# Patient Record
Sex: Male | Born: 1965 | Race: White | Hispanic: No | Marital: Single | Smoking: Current every day smoker
Health system: Southern US, Community
[De-identification: ages and names within clinical notes are randomized; demographics above are authoritative.]

## PROBLEM LIST (undated history)

## (undated) DIAGNOSIS — C9590 Leukemia, unspecified not having achieved remission: Secondary | ICD-10-CM

## (undated) DIAGNOSIS — Z720 Tobacco use: Secondary | ICD-10-CM

## (undated) HISTORY — PX: CHOLECYSTECTOMY: SHX55

---

## 2020-09-26 ENCOUNTER — Emergency Department (HOSPITAL_COMMUNITY): Payer: Medicare Other

## 2020-09-26 ENCOUNTER — Observation Stay (HOSPITAL_COMMUNITY)
Admission: EM | Admit: 2020-09-26 | Discharge: 2020-09-27 | Disposition: A | Discharge status: Home / Self Care | Payer: Medicare Other | Attending: Internal Medicine | Admitting: Internal Medicine

## 2020-09-26 DIAGNOSIS — Z7901 Long term (current) use of anticoagulants: Secondary | ICD-10-CM | POA: Insufficient documentation

## 2020-09-26 DIAGNOSIS — R7401 Elevation of levels of liver transaminase levels: Secondary | ICD-10-CM | POA: Insufficient documentation

## 2020-09-26 DIAGNOSIS — R531 Weakness: Secondary | ICD-10-CM | POA: Diagnosis not present

## 2020-09-26 DIAGNOSIS — S42354A Nondisplaced comminuted fracture of shaft of humerus, right arm, initial encounter for closed fracture: Secondary | ICD-10-CM

## 2020-09-26 DIAGNOSIS — I313 Pericardial effusion (noninflammatory): Secondary | ICD-10-CM | POA: Diagnosis not present

## 2020-09-26 DIAGNOSIS — N179 Acute kidney failure, unspecified: Secondary | ICD-10-CM

## 2020-09-26 DIAGNOSIS — S42351A Displaced comminuted fracture of shaft of humerus, right arm, initial encounter for closed fracture: Secondary | ICD-10-CM | POA: Diagnosis not present

## 2020-09-26 DIAGNOSIS — S4991XA Unspecified injury of right shoulder and upper arm, initial encounter: Secondary | ICD-10-CM | POA: Diagnosis present

## 2020-09-26 DIAGNOSIS — R2681 Unsteadiness on feet: Secondary | ICD-10-CM | POA: Diagnosis not present

## 2020-09-26 DIAGNOSIS — N200 Calculus of kidney: Secondary | ICD-10-CM | POA: Insufficient documentation

## 2020-09-26 DIAGNOSIS — Z7902 Long term (current) use of antithrombotics/antiplatelets: Secondary | ICD-10-CM | POA: Diagnosis not present

## 2020-09-26 DIAGNOSIS — Z23 Encounter for immunization: Secondary | ICD-10-CM | POA: Diagnosis not present

## 2020-09-26 DIAGNOSIS — D6851 Activated protein C resistance: Secondary | ICD-10-CM | POA: Diagnosis present

## 2020-09-26 DIAGNOSIS — J189 Pneumonia, unspecified organism: Secondary | ICD-10-CM | POA: Insufficient documentation

## 2020-09-26 DIAGNOSIS — T1490XA Injury, unspecified, initial encounter: Secondary | ICD-10-CM

## 2020-09-26 DIAGNOSIS — C9590 Leukemia, unspecified not having achieved remission: Secondary | ICD-10-CM

## 2020-09-26 DIAGNOSIS — E871 Hypo-osmolality and hyponatremia: Secondary | ICD-10-CM | POA: Diagnosis not present

## 2020-09-26 DIAGNOSIS — Z79899 Other long term (current) drug therapy: Secondary | ICD-10-CM | POA: Insufficient documentation

## 2020-09-26 DIAGNOSIS — Y900 Blood alcohol level of less than 20 mg/100 ml: Secondary | ICD-10-CM | POA: Insufficient documentation

## 2020-09-26 DIAGNOSIS — R55 Syncope and collapse: Secondary | ICD-10-CM

## 2020-09-26 DIAGNOSIS — R748 Abnormal levels of other serum enzymes: Secondary | ICD-10-CM | POA: Diagnosis not present

## 2020-09-26 DIAGNOSIS — S42291A Other displaced fracture of upper end of right humerus, initial encounter for closed fracture: Secondary | ICD-10-CM

## 2020-09-26 DIAGNOSIS — D72829 Elevated white blood cell count, unspecified: Secondary | ICD-10-CM | POA: Diagnosis not present

## 2020-09-26 DIAGNOSIS — D684 Acquired coagulation factor deficiency: Secondary | ICD-10-CM | POA: Insufficient documentation

## 2020-09-26 DIAGNOSIS — Z20822 Contact with and (suspected) exposure to covid-19: Secondary | ICD-10-CM | POA: Insufficient documentation

## 2020-09-26 DIAGNOSIS — F17210 Nicotine dependence, cigarettes, uncomplicated: Secondary | ICD-10-CM | POA: Diagnosis not present

## 2020-09-26 HISTORY — DX: Leukemia, unspecified not having achieved remission: C95.90

## 2020-09-26 HISTORY — DX: Tobacco use: Z72.0

## 2020-09-26 LAB — CK: Total CK: 715 U/L — ABNORMAL HIGH (ref 49–397)

## 2020-09-26 LAB — COMPREHENSIVE METABOLIC PANEL
ALT: 29 U/L (ref 0–44)
AST: 88 U/L — ABNORMAL HIGH (ref 15–41)
Albumin: 2.8 g/dL — ABNORMAL LOW (ref 3.5–5.0)
Alkaline Phosphatase: 47 U/L (ref 38–126)
Anion gap: 10 (ref 5–15)
BUN: 21 mg/dL — ABNORMAL HIGH (ref 6–20)
CO2: 21 mmol/L — ABNORMAL LOW (ref 22–32)
Calcium: 8.5 mg/dL — ABNORMAL LOW (ref 8.9–10.3)
Chloride: 96 mmol/L — ABNORMAL LOW (ref 98–111)
Creatinine, Ser: 1.29 mg/dL — ABNORMAL HIGH (ref 0.61–1.24)
GFR, Estimated: >60 mL/min (ref >60)
Glucose, Bld: 116 mg/dL — ABNORMAL HIGH (ref 70–99)
Potassium: 4.8 mmol/L (ref 3.5–5.1)
Sodium: 127 mmol/L — ABNORMAL LOW (ref 135–145)
Total Bilirubin: 1.9 mg/dL — ABNORMAL HIGH (ref 0.3–1.2)
Total Protein: 5.9 g/dL — ABNORMAL LOW (ref 6.5–8.1)

## 2020-09-26 LAB — CBC
HCT: 42 % (ref 39.0–52.0)
Hemoglobin: 14.7 g/dL (ref 13.0–17.0)
MCH: 31.6 pg (ref 26.0–34.0)
MCHC: 35 g/dL (ref 30.0–36.0)
MCV: 90.3 fL (ref 80.0–100.0)
Platelets: 172 10*3/uL (ref 150–400)
RBC: 4.65 MIL/uL (ref 4.22–5.81)
RDW: 12.4 % (ref 11.5–15.5)
WBC: 11 10*3/uL — ABNORMAL HIGH (ref 4.0–10.5)
nRBC: 0 % (ref 0.0–0.2)

## 2020-09-26 LAB — RAPID URINE DRUG SCREEN, HOSP PERFORMED
Amphetamines: NOT DETECTED
Barbiturates: NOT DETECTED
Benzodiazepines: NOT DETECTED
Cocaine: NOT DETECTED
Opiates: NOT DETECTED
Tetrahydrocannabinol: POSITIVE — AB

## 2020-09-26 LAB — URINALYSIS, ROUTINE W REFLEX MICROSCOPIC
Bilirubin Urine: NEGATIVE
Glucose, UA: NEGATIVE mg/dL
Ketones, ur: NEGATIVE mg/dL
Leukocytes,Ua: NEGATIVE
Nitrite: NEGATIVE
Protein, ur: 30 mg/dL — AB
RBC / HPF: >50 RBC/hpf — ABNORMAL HIGH (ref 0–5)
Specific Gravity, Urine: 1.024 (ref 1.005–1.030)
pH: 5 (ref 5.0–8.0)

## 2020-09-26 LAB — ETHANOL: Alcohol, Ethyl (B): <10 mg/dL (ref <10)

## 2020-09-26 LAB — HEPATITIS PANEL, ACUTE
HCV Ab: NONREACTIVE
Hep A IgM: NONREACTIVE
Hep B C IgM: NONREACTIVE
Hepatitis B Surface Ag: NONREACTIVE

## 2020-09-26 LAB — SAMPLE TO BLOOD BANK

## 2020-09-26 LAB — LACTIC ACID, PLASMA: Lactic Acid, Venous: 1.9 mmol/L (ref 0.5–1.9)

## 2020-09-26 LAB — PROTIME-INR
INR: 1.3 — ABNORMAL HIGH (ref 0.8–1.2)
Prothrombin Time: 16.2 seconds — ABNORMAL HIGH (ref 11.4–15.2)

## 2020-09-26 LAB — RESP PANEL BY RT-PCR (FLU A&B, COVID) ARPGX2
Influenza A by PCR: NEGATIVE
Influenza B by PCR: NEGATIVE
SARS Coronavirus 2 by RT PCR: NEGATIVE

## 2020-09-26 LAB — PROCALCITONIN: Procalcitonin: 0.44 ng/mL

## 2020-09-26 LAB — HIV ANTIBODY (ROUTINE TESTING W REFLEX): HIV Screen 4th Generation wRfx: NONREACTIVE

## 2020-09-26 MED ORDER — ACETAMINOPHEN 325 MG PO TABS
650.0000 mg | ORAL_TABLET | Freq: Four times a day (QID) | ORAL | Status: DC | PRN
  Administered 2020-09-26: 650 mg via ORAL
  Filled 2020-09-26: qty 2

## 2020-09-26 MED ORDER — ENOXAPARIN SODIUM 40 MG/0.4ML IJ SOSY
40.0000 mg | PREFILLED_SYRINGE | INTRAMUSCULAR | Status: DC
  Administered 2020-09-27: 40 mg via SUBCUTANEOUS
  Filled 2020-09-26: qty 0.4

## 2020-09-26 MED ORDER — SODIUM CHLORIDE 0.9% FLUSH
3.0000 mL | Freq: Two times a day (BID) | INTRAVENOUS | Status: DC
  Administered 2020-09-26 – 2020-09-27 (×2): 3 mL via INTRAVENOUS

## 2020-09-26 MED ORDER — SODIUM CHLORIDE 0.9 % IV SOLN: INTRAVENOUS | Status: DC

## 2020-09-26 MED ORDER — ACETAMINOPHEN 650 MG RE SUPP: 650.0000 mg | Freq: Four times a day (QID) | RECTAL | Status: DC | PRN

## 2020-09-26 MED ORDER — OXYCODONE HCL 5 MG PO TABS
5.0000 mg | ORAL_TABLET | ORAL | Status: DC | PRN
  Administered 2020-09-26 – 2020-09-27 (×5): 5 mg via ORAL
  Filled 2020-09-26 (×5): qty 1

## 2020-09-26 MED ORDER — ALBUTEROL SULFATE (2.5 MG/3ML) 0.083% IN NEBU: 2.5000 mg | INHALATION_SOLUTION | Freq: Four times a day (QID) | RESPIRATORY_TRACT | Status: DC | PRN

## 2020-09-26 MED ORDER — FENTANYL CITRATE (PF) 100 MCG/2ML IJ SOLN
50.0000 ug | Freq: Once | INTRAMUSCULAR | Status: AC
  Administered 2020-09-26: 50 ug via INTRAVENOUS

## 2020-09-26 MED ORDER — OXYCODONE HCL 5 MG PO TABS
5.0000 mg | ORAL_TABLET | Freq: Once | ORAL | Status: AC
  Administered 2020-09-26: 5 mg via ORAL
  Filled 2020-09-26: qty 1

## 2020-09-26 MED ORDER — MORPHINE SULFATE (PF) 2 MG/ML IV SOLN
2.0000 mg | INTRAVENOUS | Status: DC | PRN
  Administered 2020-09-26: 2 mg via INTRAVENOUS
  Filled 2020-09-26: qty 1

## 2020-09-26 MED ORDER — SODIUM CHLORIDE 0.9 % IV SOLN
500.0000 mg | Freq: Once | INTRAVENOUS | Status: AC
  Administered 2020-09-26: 500 mg via INTRAVENOUS
  Filled 2020-09-26: qty 500

## 2020-09-26 MED ORDER — TETANUS-DIPHTH-ACELL PERTUSSIS 5-2.5-18.5 LF-MCG/0.5 IM SUSY
0.5000 mL | PREFILLED_SYRINGE | Freq: Once | INTRAMUSCULAR | Status: AC
  Administered 2020-09-26: 0.5 mL via INTRAMUSCULAR
  Filled 2020-09-26: qty 0.5

## 2020-09-26 MED ORDER — SODIUM CHLORIDE 0.9 % IV SOLN
1.0000 g | Freq: Once | INTRAVENOUS | Status: AC
  Administered 2020-09-26: 1 g via INTRAVENOUS
  Filled 2020-09-26: qty 10

## 2020-09-26 MED ORDER — FENTANYL CITRATE (PF) 100 MCG/2ML IJ SOLN
50.0000 ug | Freq: Once | INTRAMUSCULAR | Status: AC
  Administered 2020-09-26: 50 ug via INTRAVENOUS
  Filled 2020-09-26: qty 2
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
## 2020-09-26 NOTE — ED Notes (Signed)
Pt requesting pain medication; MD advised, requesting UDS. Will collect urine & send down

## 2020-09-26 NOTE — ED Notes (Signed)
Called for report, RN line busy, will call back with direct ext [phone].

## 2020-09-26 NOTE — Progress Notes (Signed)
Samuel Gill is a 55 y.o. male patient admitted. Awake, alert - oriented  X 4 - no acute distress noted.  VSS - Blood pressure 119/69, pulse 64, temperature 98.9 F (37.2 C), temperature source Oral, resp. rate 19, height '6\' 2"'$  (1.88 m), weight 104.3 kg, SpO2 97 %.    IV in place, occlusive dsg intact without redness.     Will cont to eval and treat per MD orders.  Vidal Schwalbe, RN 09/26/2020 2030
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
## 2020-09-26 NOTE — ED Notes (Signed)
Patient transported to X-ray 

## 2020-09-26 NOTE — ED Notes (Signed)
Called for report, floor is moving pt to different room and requests more time for the receiving RN.
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
## 2020-09-26 NOTE — ED Notes (Signed)
Patient transported to CT with TRN.  
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
## 2020-09-26 NOTE — Progress Notes (Signed)
Orthopedic Tech Progress Note Patient Details:  Samuel Gill [birth year] [id number]  Ortho Devices Type of Ortho Device: Sling immobilizer Ortho Device/Splint Location: RUE Ortho Device/Splint Interventions: Ordered, Application, Adjustment   Post Interventions Patient Tolerated: Well, Fair Instructions Provided: Care of device  Janit Pagan 09/26/2020, 2:47 PM
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
## 2020-09-27 ENCOUNTER — Observation Stay (HOSPITAL_BASED_OUTPATIENT_CLINIC_OR_DEPARTMENT_OTHER): Payer: Medicare Other

## 2020-09-27 ENCOUNTER — Encounter (HOSPITAL_COMMUNITY): Payer: Self-pay | Admitting: Internal Medicine

## 2020-09-27 DIAGNOSIS — S42351A Displaced comminuted fracture of shaft of humerus, right arm, initial encounter for closed fracture: Secondary | ICD-10-CM | POA: Diagnosis present

## 2020-09-27 DIAGNOSIS — D6851 Activated protein C resistance: Secondary | ICD-10-CM | POA: Diagnosis present

## 2020-09-27 DIAGNOSIS — E871 Hypo-osmolality and hyponatremia: Secondary | ICD-10-CM | POA: Diagnosis present

## 2020-09-27 DIAGNOSIS — R748 Abnormal levels of other serum enzymes: Secondary | ICD-10-CM | POA: Diagnosis present

## 2020-09-27 DIAGNOSIS — C9590 Leukemia, unspecified not having achieved remission: Secondary | ICD-10-CM | POA: Diagnosis present

## 2020-09-27 DIAGNOSIS — S42291A Other displaced fracture of upper end of right humerus, initial encounter for closed fracture: Secondary | ICD-10-CM | POA: Diagnosis not present

## 2020-09-27 DIAGNOSIS — I313 Pericardial effusion (noninflammatory): Secondary | ICD-10-CM | POA: Diagnosis not present

## 2020-09-27 DIAGNOSIS — N179 Acute kidney failure, unspecified: Secondary | ICD-10-CM | POA: Diagnosis present

## 2020-09-27 DIAGNOSIS — N200 Calculus of kidney: Secondary | ICD-10-CM

## 2020-09-27 LAB — CBC
HCT: 37.7 % — ABNORMAL LOW (ref 39.0–52.0)
Hemoglobin: 13.2 g/dL (ref 13.0–17.0)
MCH: 30.9 pg (ref 26.0–34.0)
MCHC: 35 g/dL (ref 30.0–36.0)
MCV: 88.3 fL (ref 80.0–100.0)
Platelets: 171 10*3/uL (ref 150–400)
RBC: 4.27 MIL/uL (ref 4.22–5.81)
RDW: 12.3 % (ref 11.5–15.5)
WBC: 11.1 10*3/uL — ABNORMAL HIGH (ref 4.0–10.5)
nRBC: 0 % (ref 0.0–0.2)

## 2020-09-27 LAB — ECHOCARDIOGRAM COMPLETE
Area-P 1/2: 2.9 cm2
Calc EF: 62.2 %
Height: 74 in
S' Lateral: 4.8 cm
Single Plane A2C EF: 69.3 %
Single Plane A4C EF: 48.3 %
Weight: 3680 oz

## 2020-09-27 LAB — POCT I-STAT, CHEM 8
BUN: 31 mg/dL — ABNORMAL HIGH (ref 6–20)
Calcium, Ion: 0.96 mmol/L — ABNORMAL LOW (ref 1.15–1.40)
Chloride: 96 mmol/L — ABNORMAL LOW (ref 98–111)
Creatinine, Ser: 1.2 mg/dL (ref 0.61–1.24)
Glucose, Bld: 114 mg/dL — ABNORMAL HIGH (ref 70–99)
HCT: 42 % (ref 39.0–52.0)
Hemoglobin: 14.3 g/dL (ref 13.0–17.0)
Potassium: 5.3 mmol/L — ABNORMAL HIGH (ref 3.5–5.1)
Sodium: 127 mmol/L — ABNORMAL LOW (ref 135–145)
TCO2: 25 mmol/L (ref 22–32)

## 2020-09-27 LAB — BASIC METABOLIC PANEL
Anion gap: 10 (ref 5–15)
BUN: 19 mg/dL (ref 6–20)
CO2: 23 mmol/L (ref 22–32)
Calcium: 8.5 mg/dL — ABNORMAL LOW (ref 8.9–10.3)
Chloride: 97 mmol/L — ABNORMAL LOW (ref 98–111)
Creatinine, Ser: 0.96 mg/dL (ref 0.61–1.24)
GFR, Estimated: >60 mL/min (ref >60)
Glucose, Bld: 113 mg/dL — ABNORMAL HIGH (ref 70–99)
Potassium: 3.7 mmol/L (ref 3.5–5.1)
Sodium: 130 mmol/L — ABNORMAL LOW (ref 135–145)

## 2020-09-27 LAB — PROCALCITONIN: Procalcitonin: 0.39 ng/mL

## 2020-09-27 MED ORDER — CLOPIDOGREL BISULFATE 75 MG PO TABS: 75.0000 mg | ORAL_TABLET | Freq: Every day | ORAL | Status: DC

## 2020-09-27 MED ORDER — AZITHROMYCIN 500 MG PO TABS: 500.0000 mg | ORAL_TABLET | Freq: Every day | ORAL | 0 refills | Status: AC

## 2020-09-27 MED ORDER — SODIUM CHLORIDE 0.9 % IV SOLN
1.0000 g | INTRAVENOUS | Status: DC
Start: 2020-09-27 — End: 2020-09-28
  Administered 2020-09-27: 1 g via INTRAVENOUS
  Filled 2020-09-27: qty 10

## 2020-09-27 MED ORDER — SODIUM CHLORIDE 0.9 % IV SOLN
500.0000 mg | INTRAVENOUS | Status: DC
Start: 2020-09-27 — End: 2020-09-28
  Administered 2020-09-27: 500 mg via INTRAVENOUS
  Filled 2020-09-27 (×2): qty 500

## 2020-09-27 MED ORDER — PERFLUTREN LIPID MICROSPHERE
1.0000 mL | INTRAVENOUS | Status: AC | PRN
  Administered 2020-09-27: 2 mL via INTRAVENOUS
  Filled 2020-09-27: qty 10

## 2020-09-27 MED ORDER — GUAIFENESIN ER 600 MG PO TB12
600.0000 mg | ORAL_TABLET | Freq: Two times a day (BID) | ORAL | Status: DC
  Administered 2020-09-27: 600 mg via ORAL
  Filled 2020-09-27: qty 1

## 2020-09-27 MED ORDER — GUAIFENESIN ER 600 MG PO TB12: 600.0000 mg | ORAL_TABLET | Freq: Two times a day (BID) | ORAL | 0 refills | Status: AC

## 2020-09-27 MED ORDER — NICOTINE 21 MG/24HR TD PT24: 21.0000 mg | MEDICATED_PATCH | Freq: Every day | TRANSDERMAL | 0 refills | Status: AC

## 2020-09-27 MED ORDER — OXYCODONE-ACETAMINOPHEN 5-325 MG PO TABS: 1.0000 | ORAL_TABLET | Freq: Four times a day (QID) | ORAL | 0 refills | Status: AC | PRN

## 2020-09-27 MED ORDER — NICOTINE 21 MG/24HR TD PT24
21.0000 mg | MEDICATED_PATCH | Freq: Every day | TRANSDERMAL | Status: DC
Start: 2020-09-27 — End: 2020-09-28
  Administered 2020-09-27: 21 mg via TRANSDERMAL
  Filled 2020-09-27: qty 1

## 2020-09-27 MED ORDER — DRONABINOL 2.5 MG PO CAPS
5.0000 mg | ORAL_CAPSULE | Freq: Two times a day (BID) | ORAL | Status: DC
  Administered 2020-09-27: 5 mg via ORAL
  Filled 2020-09-27: qty 2

## 2020-09-27 MED ORDER — AZITHROMYCIN 500 MG PO TABS: 500.0000 mg | ORAL_TABLET | Freq: Every day | ORAL | 0 refills | Status: DC

## 2020-09-27 MED ORDER — RIVAROXABAN 20 MG PO TABS: 20.0000 mg | ORAL_TABLET | Freq: Every day | ORAL | Status: AC

## 2020-09-27 MED ORDER — AMOXICILLIN-POT CLAVULANATE 875-125 MG PO TABS: 1.0000 | ORAL_TABLET | Freq: Two times a day (BID) | ORAL | 0 refills | Status: AC

## 2020-09-27 NOTE — TOC CAGE-AID Note (Signed)
Transition of Care [facility]) - CAGE-AID Screening   Patient Details  Name: Marquarius Obier MRN: [id number] Date of Birth: 1965-08-28  Transition of Care [facility]) CM/SW Contact:    Gaetano Hawthorne Tarpley-Carter, LCSWA Phone Number: 09/27/2020, 4:12 PM   Clinical Narrative: Pt participated in [place].  Pt stated he does not use substance, but stopped using ETOH when he was 30.  Pt was offered resources.  Pt stated they did not feel that they were in need of resources at this time.   Emberleigh Reily Tarpley-Carter, MSW, LCSW-A Pronouns:  She/Her/Hers [facility] Clinical Social Worker Direct Number:  [phone] [email or web address]    CAGE-AID Screening:    Have You Ever Felt You Ought to Cut Down on Your Drinking or Drug Use?: No Have People Annoyed You By [employer] Your Drinking Or Drug Use?: No Have You Felt Bad Or Guilty About Your Drinking Or Drug Use?: No Have You Ever Had a Drink or Used Drugs First Thing In The Morning to Steady Your Nerves or to Get Rid of a Hangover?: No CAGE-AID Score: 0  Substance Abuse Education Offered: Yes
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
## 2020-09-27 NOTE — Progress Notes (Signed)
*  Echocardiogram 2D Echocardiogram has been performed.  Samuel Gill M 09/27/2020, 11:07 AM
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
## 2020-09-27 NOTE — Progress Notes (Signed)
Celso Amy to be D/C'd  per MD order.  Discussed with the patient and all questions fully answered.  VSS, Skin clean, dry and intact without evidence of skin break down, no evidence of skin tears noted.  IV catheter discontinued intact. Site without signs and symptoms of complications. Dressing and pressure applied.  An After Visit Summary was printed and given to the patient. Patient was informed by his pharmacy that he will not be able to pick up percocet until 09/28/20 at 9 am, since the pharmacy will close. He received oxy at 1559, was not able to give another dose until 2000.  Patient instructed to return to ED, call 911, or call MD for any changes in condition.   Patient to be escorted via [place], and D/C home via private auto.

## 2020-09-27 NOTE — Discharge Summary (Signed)
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 AND ANAEROBIC Blood Culture results may not be optimal due to an inadequate volume of blood received in culture bottles   Culture   Final    NO GROWTH < 24 HOURS Performed at [facility] [address], [place], [place] [address]    Report Status PENDING  Incomplete     Labs: CBC: Recent Labs  Lab 09/26/20 1047 09/26/20 1103 09/27/20 0151  WBC 11.0*  --  11.1*  HGB 14.7 14.3 13.2  HCT 42.0 42.0 37.7*  MCV 90.3  --  88.3  PLT 172  --  [id number]   Basic Metabolic Panel: Recent Labs  Lab 09/26/20 1047 09/26/20 1103 09/27/20 0151  NA 127* 127* 130*  K 4.8 5.3* 3.7  CL 96* 96* 97*  CO2 21*  --  23  GLUCOSE 116* 114* 113*  BUN 21* 31* 19  CREATININE 1.29* 1.20 0.96  CALCIUM 8.5*  --  8.5*   Liver Function Tests: Recent Labs  Lab 09/26/20 1047  AST 88*  ALT 29  ALKPHOS 47  BILITOT 1.9*  PROT 5.9*  ALBUMIN 2.8*    CBG: No results for input(s): GLUCAP in the last 168 hours.  Time spent: 35 minutes  Signed:  Berle Mull  [facility]  09/27/2020

## 2020-10-01 LAB — CULTURE, BLOOD (ROUTINE X 2): Culture: NO GROWTH
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
